# Patient Record
Sex: Female | Born: 2000 | Race: White | Hispanic: No | Marital: Single
Health system: Southern US, Community
[De-identification: ages and names within clinical notes are randomized; demographics above are authoritative.]

---

## 2018-10-21 ENCOUNTER — Other Ambulatory Visit: Payer: Self-pay

## 2018-10-21 ENCOUNTER — Emergency Department: Payer: BLUE CROSS/BLUE SHIELD

## 2018-10-21 ENCOUNTER — Encounter: Payer: Self-pay | Admitting: Emergency Medicine

## 2018-10-21 ENCOUNTER — Emergency Department
Admission: EM | Admit: 2018-10-21 | Discharge: 2018-10-21 | Disposition: A | Discharge status: Home / Self Care | Payer: BLUE CROSS/BLUE SHIELD | Attending: Emergency Medicine | Admitting: Emergency Medicine

## 2018-10-21 DIAGNOSIS — Y9389 Activity, other specified: Secondary | ICD-10-CM | POA: Diagnosis not present

## 2018-10-21 DIAGNOSIS — Y998 Other external cause status: Secondary | ICD-10-CM | POA: Insufficient documentation

## 2018-10-21 DIAGNOSIS — W2209XA Striking against other stationary object, initial encounter: Secondary | ICD-10-CM | POA: Diagnosis not present

## 2018-10-21 DIAGNOSIS — Y929 Unspecified place or not applicable: Secondary | ICD-10-CM | POA: Insufficient documentation

## 2018-10-21 DIAGNOSIS — S81832A Puncture wound without foreign body, left lower leg, initial encounter: Secondary | ICD-10-CM

## 2018-10-21 DIAGNOSIS — S80812A Abrasion, left lower leg, initial encounter: Secondary | ICD-10-CM | POA: Insufficient documentation

## 2018-10-21 DIAGNOSIS — Z23 Encounter for immunization: Secondary | ICD-10-CM | POA: Insufficient documentation

## 2018-10-21 DIAGNOSIS — S8992XA Unspecified injury of left lower leg, initial encounter: Secondary | ICD-10-CM | POA: Diagnosis present

## 2018-10-21 MED ORDER — CEPHALEXIN 500 MG PO CAPS: 500.0000 mg | ORAL_CAPSULE | Freq: Four times a day (QID) | ORAL | 0 refills | Status: AC

## 2018-10-21 MED ORDER — TETANUS-DIPHTH-ACELL PERTUSSIS 5-2.5-18.5 LF-MCG/0.5 IM SUSP
0.5000 mL | Freq: Once | INTRAMUSCULAR | Status: AC
  Administered 2018-10-21: 0.5 mL via INTRAMUSCULAR
  Filled 2018-10-21: qty 0.5

## 2018-10-21 NOTE — ED Triage Notes (Signed)
Slipped and hit L shin last night on brick.

## 2018-10-21 NOTE — ED Provider Notes (Signed)
[facility] EMERGENCY DEPARTMENT Provider Note   CSN: [id number] Arrival date & time: 10/21/18  1517     History   Chief Complaint Chief Complaint  Patient presents with  . Laceration    HPI Dawn Williamson is a 18 y.o. female.  Presents to the emergency department evaluation of puncture wound and abrasion to the left anterior tibia.  Patient states around midnight last night she was walking and trying to go over a small brick ledge when she scraped her shin.  She had some bleeding and mild pain.  She is been able to ambulate.  She denies any other injury to her body.  She has soreness to the anterior left shin.  Her tetanus is up-to-date.  No warmth redness or purulent drainage.  HPI  No past medical history on file.  There are no active problems to display for this patient.   History reviewed. No pertinent surgical history.   OB History   No obstetric history on file.      Home Medications    Prior to Admission medications   Medication Sig Start Date End Date Taking? Authorizing Provider  cephALEXin (KEFLEX) 500 MG capsule Take 1 capsule (500 mg total) by mouth 4 (four) times daily for 7 days. 10/21/18 10/28/18  Evon Slack, PA-C    Family History No family history on file.  Social History Social History   Tobacco Use  . Smoking status: Not on file  Substance Use Topics  . Alcohol use: Not on file  . Drug use: Not on file     Allergies   Augmentin [amoxicillin-pot clavulanate]; Azithromycin; and Sulfa antibiotics   Review of Systems Review of Systems  Constitutional: Negative for activity change.  Cardiovascular: Negative for chest pain and leg swelling.  Genitourinary: Negative for pelvic pain.  Musculoskeletal: Negative for arthralgias, gait problem, joint swelling, myalgias, neck pain and neck stiffness.  Skin: Positive for wound.  Neurological: Negative for numbness and headaches.     Physical Exam Updated Vital  Signs BP 109/73   Pulse 79   Temp 98 F (36.7 C) (Oral)   Resp 18   Ht 5\' 6"  (1.676 m)   Wt 59 kg   LMP 09/30/2018   SpO2 98%   BMI 20.98 kg/m   Physical Exam Constitutional:      Appearance: She is well-developed.  HENT:     Head: Normocephalic and atraumatic.  Eyes:     Conjunctiva/sclera: Conjunctivae normal.  Neck:     Musculoskeletal: Normal range of motion.  Cardiovascular:     Rate and Rhythm: Normal rate.  Pulmonary:     Effort: Pulmonary effort is normal. No respiratory distress.  Musculoskeletal: Normal range of motion.     Comments: Left lower extremity shows no swelling warmth or redness.  1 cm diameter abrasion with puncture wound centrally that is 1 to 2 mm wide.  No visible or palpable foreign body.  Abrasion and puncture wound cleaned with Betadine and saline.  Skin:    General: Skin is warm.     Findings: No rash.  Neurological:     Mental Status: She is alert and oriented to person, place, and time.  Psychiatric:        Behavior: Behavior normal.        Thought Content: Thought content normal.      ED Treatments / Results  Labs (all labs ordered are listed, but only abnormal results are displayed) Labs Reviewed - No data  to display  EKG None  Radiology Dg Tibia/fibula Left  Result Date: 10/21/2018 CLINICAL DATA:  Puncture wound on anterior surface of mid-tib/fib after hitting on brick ledge last night. Eval for FB. No previous injury or surgery to left tib/fib. EXAM: LEFT TIBIA AND FIBULA - 2 VIEW COMPARISON:  None. FINDINGS: No fracture.  No bone lesion. Knee and ankle joints are normally spaced and aligned. Soft tissues are unremarkable.  No radiopaque foreign body. IMPRESSION: No fracture or radiopaque foreign body. Electronically Signed   By: Amie Portlandavid  Ormond M.D.   On: 10/21/2018 17:26     Procedures .Marland Kitchen.Laceration Repair Date/Time: 10/21/2018 5:45 PM Performed by: Evon SlackGaines, Haruo Stepanek C, PA-C Authorized by: Evon SlackGaines, Nickolaos Brallier C, PA-C   Consent:     Consent obtained:  Verbal   Consent given by:  Patient   Risks discussed:  Retained foreign body   Alternatives discussed:  No treatment and delayed treatment Laceration details:    Length (cm):  0.3   Depth (mm):  0.5 Repair type:    Repair type:  Simple Pre-procedure details:    Preparation:  Patient was prepped and draped in usual sterile fashion Exploration:    Wound exploration: wound explored through full range of motion and entire depth of wound probed and visualized     Wound extent: no foreign bodies/material noted     Contaminated: no   Treatment:    Area cleansed with:  Betadine and saline   Amount of cleaning:  Extensive   Irrigation solution:  Sterile saline   Irrigation method:  Pressure wash Skin repair:    Repair method:  Tissue adhesive Approximation:    Approximation:  Close Post-procedure details:    Dressing:  Adhesive bandage   Patient tolerance of procedure:  Tolerated well, no immediate complications   (including critical care time)  Medications Ordered in ED Medications  Tdap (BOOSTRIX) injection 0.5 mL (has no administration in time range)     Initial Impression / Assessment and Plan / ED Course  I have reviewed the triage vital signs and the nursing notes.  Pertinent labs & imaging results that were available during my care of the patient were reviewed by me and considered in my medical decision making (see chart for details).     18 year old female with puncture wound and abrasion to the left shin.  X-rays ruled out any radiopaque foreign bodies or fracture.  Tetanus is updated today.  Wound thoroughly irrigated with saline and Betadine with pressure irrigation and then Dermabond applied.  She is placed on prophylactic antibiotics and she understands signs symptoms return to ED for.  Final Clinical Impressions(s) / ED Diagnoses   Final diagnoses:  Abrasion of left lower extremity, initial encounter  Puncture wound of left lower leg, initial  encounter    ED Discharge Orders         Ordered    cephALEXin (KEFLEX) 500 MG capsule  4 times daily     10/21/18 1744           Ronnette JuniperGaines, Carisma Troupe C, PA-C 10/21/18 1746    Sharman CheekStafford, Phillip, MD 10/23/18 0006

## 2018-10-21 NOTE — Discharge Instructions (Addendum)
You may shower but do not submerge wound under water for 1 week.  Take antibiotics as prescribed.  Return to the ER or walk-in clinic for any warmth redness pain or swelling

## 2018-10-24 ENCOUNTER — Telehealth: Payer: Self-pay | Admitting: Emergency Medicine

## 2018-10-24 NOTE — Telephone Encounter (Signed)
Called patient as patient's mother has called several times, but patient is over [phone].  I would like to check on her to see if she is having any problems with her wound as her mother indicated.  I left a message asking her to call me back.

## 2019-07-26 ENCOUNTER — Other Ambulatory Visit: Payer: Self-pay

## 2019-07-26 DIAGNOSIS — Z20822 Contact with and (suspected) exposure to covid-19: Secondary | ICD-10-CM

## 2019-07-28 LAB — NOVEL CORONAVIRUS, NAA: SARS-CoV-2, NAA: DETECTED — AB

## 2020-01-07 ENCOUNTER — Other Ambulatory Visit: Payer: Self-pay

## 2020-01-07 ENCOUNTER — Ambulatory Visit: Payer: Self-pay | Attending: Internal Medicine

## 2020-01-07 DIAGNOSIS — Z23 Encounter for immunization: Secondary | ICD-10-CM

## 2020-01-07 NOTE — Progress Notes (Signed)
   Covid-19 Vaccination Clinic  Name:  Dawn Williamson    MRN: [id number] DOB: [birth year]  01/07/2020  [name] was observed post Covid-19 immunization for 15 minutes without incident. She was provided with Vaccine Information Sheet and instruction to access the V-Safe system.   [name] was instructed to call 911 with any severe reactions post vaccine: Marland Kitchen Difficulty breathing  . Swelling of face and throat  . A fast heartbeat  . A bad rash all over body  . Dizziness and weakness   Immunizations Administered    Name Date Dose VIS Date Route   Pfizer COVID-19 Vaccine 01/07/2020  4:47 PM 0.3 mL 09/21/2019 Intramuscular   Manufacturer: [employer], [employer]   Lot: [id number]   NDC: [address]-2

## 2020-01-28 ENCOUNTER — Ambulatory Visit: Payer: Self-pay

## 2020-01-28 ENCOUNTER — Ambulatory Visit: Payer: Self-pay | Attending: Internal Medicine

## 2020-01-28 DIAGNOSIS — Z23 Encounter for immunization: Secondary | ICD-10-CM

## 2020-01-28 NOTE — Progress Notes (Signed)
   Covid-19 Vaccination Clinic  Name:  Dawn Williamson    MRN: [id number] DOB: 21-Jan-2001  01/28/2020  [name] was observed post Covid-19 immunization for 15 minutes without incident. She was provided with Vaccine Information Sheet and instruction to access the V-Safe system.   [name] was instructed to call 911 with any severe reactions post vaccine: Marland Kitchen Difficulty breathing  . Swelling of face and throat  . A fast heartbeat  . A bad rash all over body  . Dizziness and weakness   Immunizations Administered    Name Date Dose VIS Date Route   Pfizer COVID-19 Vaccine 01/28/2020  4:34 PM 0.3 mL 12/05/2018 Intramuscular   Manufacturer: [employer], [employer]   Lot: [id number]   NDC: [address]-9

## 2020-08-22 IMAGING — DX DG TIBIA/FIBULA 2V*L*
2 series · 2 of 2 positions shown · non-contrast
Comparison: None.

CLINICAL DATA: Puncture wound on anterior surface of mid-tib/fib
after hitting on brick ledge last night. Eval for FB. No previous
injury or surgery to left tib/fib.

EXAM:
LEFT TIBIA AND FIBULA - 2 VIEW

[tibia ap]
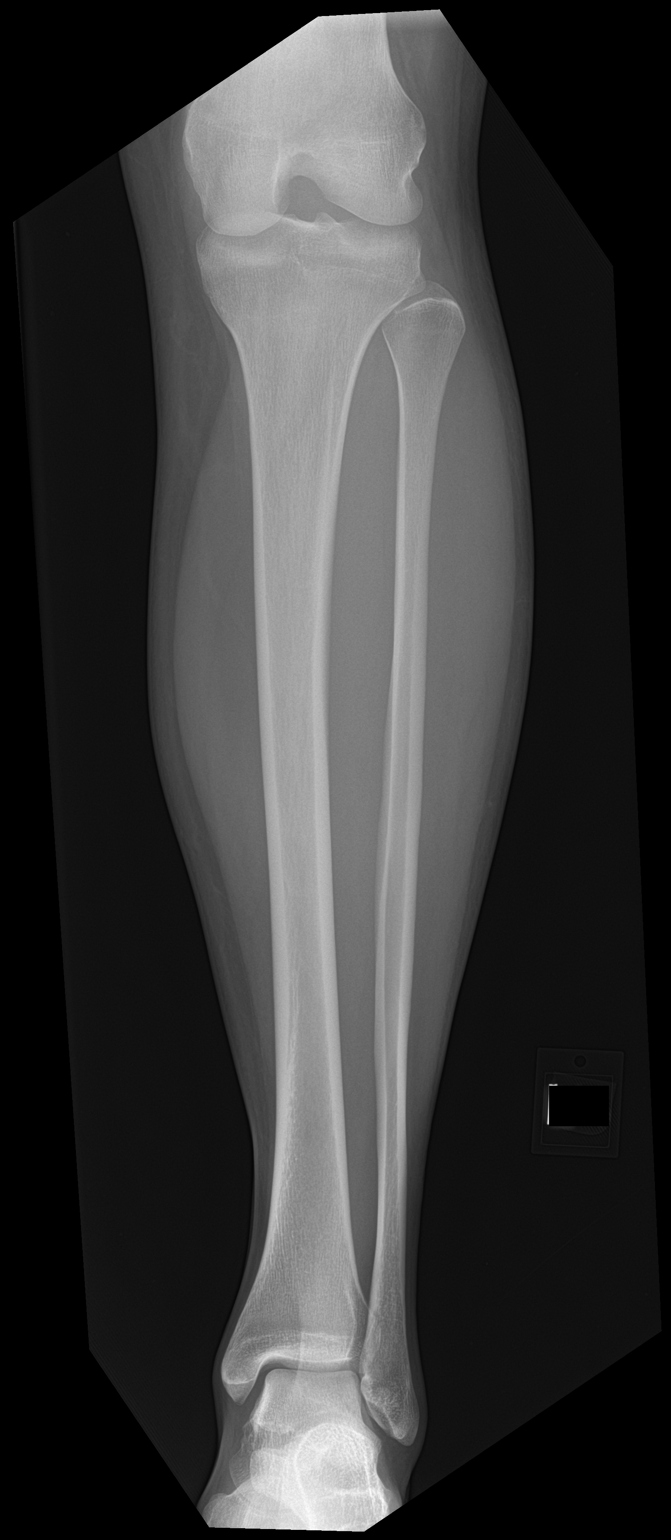

[tibia lat]
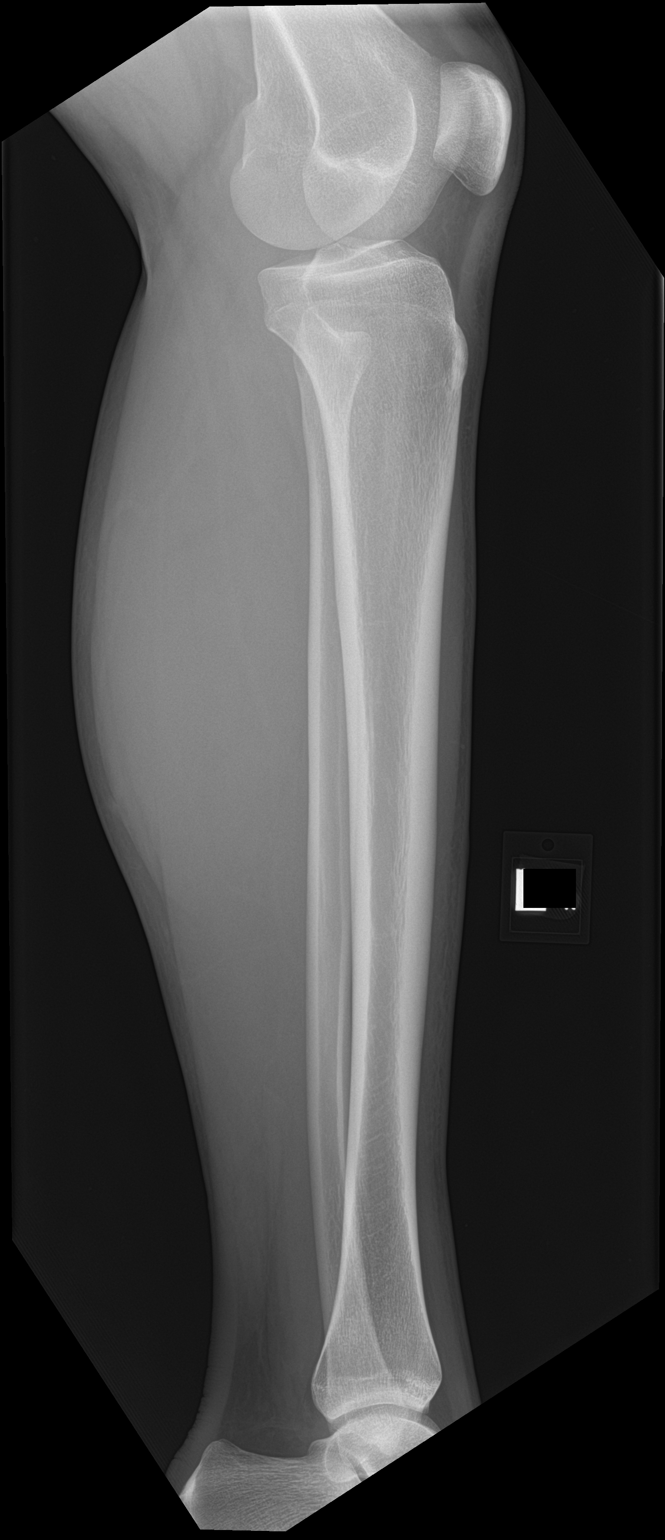

[2 of 2 positions shown; findings below may reference images not displayed]

FINDINGS: No fracture.  No bone lesion.

Knee and ankle joints are normally spaced and aligned.

Soft tissues are unremarkable.  No radiopaque foreign body.
IMPRESSION: No fracture or radiopaque foreign body.
